# Patient Record
Sex: Female | Born: 1960
Health system: Southern US, Community
[De-identification: ages and names within clinical notes are randomized; demographics above are authoritative.]

## PROBLEM LIST (undated history)

## (undated) DIAGNOSIS — I82409 Acute embolism and thrombosis of unspecified deep veins of unspecified lower extremity: Secondary | ICD-10-CM

## (undated) DIAGNOSIS — F419 Anxiety disorder, unspecified: Secondary | ICD-10-CM

## (undated) DIAGNOSIS — F329 Major depressive disorder, single episode, unspecified: Secondary | ICD-10-CM

## (undated) DIAGNOSIS — E785 Hyperlipidemia, unspecified: Secondary | ICD-10-CM

## (undated) DIAGNOSIS — F32A Depression, unspecified: Secondary | ICD-10-CM

## (undated) DIAGNOSIS — D689 Coagulation defect, unspecified: Secondary | ICD-10-CM

## (undated) DIAGNOSIS — Z5189 Encounter for other specified aftercare: Secondary | ICD-10-CM

## (undated) DIAGNOSIS — I10 Essential (primary) hypertension: Secondary | ICD-10-CM

## (undated) HISTORY — DX: Coagulation defect, unspecified: D68.9

## (undated) HISTORY — PX: HIP FRACTURE SURGERY: SHX118

## (undated) HISTORY — PX: TUBAL LIGATION: SHX77

## (undated) HISTORY — DX: Acute embolism and thrombosis of unspecified deep veins of unspecified lower extremity: I82.409

## (undated) HISTORY — DX: Depression, unspecified: F32.A

## (undated) HISTORY — DX: Essential (primary) hypertension: I10

## (undated) HISTORY — DX: Encounter for other specified aftercare: Z51.89

## (undated) HISTORY — PX: CARPAL TUNNEL RELEASE: SHX101

## (undated) HISTORY — DX: Hyperlipidemia, unspecified: E78.5

## (undated) HISTORY — DX: Anxiety disorder, unspecified: F41.9

---
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
## 2019-02-12 DIAGNOSIS — F17290 Nicotine dependence, other tobacco product, uncomplicated: Secondary | ICD-10-CM | POA: Diagnosis not present

## 2019-02-12 DIAGNOSIS — F411 Generalized anxiety disorder: Secondary | ICD-10-CM | POA: Diagnosis not present

## 2019-02-12 DIAGNOSIS — F331 Major depressive disorder, recurrent, moderate: Secondary | ICD-10-CM | POA: Diagnosis not present

## 2019-02-12 DIAGNOSIS — I10 Essential (primary) hypertension: Secondary | ICD-10-CM | POA: Diagnosis not present

## 2019-03-11 DIAGNOSIS — R635 Abnormal weight gain: Secondary | ICD-10-CM | POA: Diagnosis not present

## 2019-03-11 DIAGNOSIS — F331 Major depressive disorder, recurrent, moderate: Secondary | ICD-10-CM | POA: Diagnosis not present

## 2019-03-11 DIAGNOSIS — F411 Generalized anxiety disorder: Secondary | ICD-10-CM | POA: Diagnosis not present

## 2019-03-11 DIAGNOSIS — I10 Essential (primary) hypertension: Secondary | ICD-10-CM | POA: Diagnosis not present

## 2019-04-23 DIAGNOSIS — Z20828 Contact with and (suspected) exposure to other viral communicable diseases: Secondary | ICD-10-CM | POA: Diagnosis not present

## 2019-04-23 DIAGNOSIS — J069 Acute upper respiratory infection, unspecified: Secondary | ICD-10-CM | POA: Diagnosis not present

## 2019-05-09 DIAGNOSIS — E782 Mixed hyperlipidemia: Secondary | ICD-10-CM | POA: Diagnosis not present

## 2019-05-09 DIAGNOSIS — Z0184 Encounter for antibody response examination: Secondary | ICD-10-CM | POA: Diagnosis not present

## 2019-05-09 DIAGNOSIS — Z0000 Encounter for general adult medical examination without abnormal findings: Secondary | ICD-10-CM | POA: Diagnosis not present

## 2019-05-14 DIAGNOSIS — Z6837 Body mass index (BMI) 37.0-37.9, adult: Secondary | ICD-10-CM | POA: Diagnosis not present

## 2019-05-14 DIAGNOSIS — Z01419 Encounter for gynecological examination (general) (routine) without abnormal findings: Secondary | ICD-10-CM | POA: Diagnosis not present

## 2019-05-14 DIAGNOSIS — Z1211 Encounter for screening for malignant neoplasm of colon: Secondary | ICD-10-CM | POA: Diagnosis not present

## 2019-05-14 DIAGNOSIS — Z23 Encounter for immunization: Secondary | ICD-10-CM | POA: Diagnosis not present

## 2019-05-14 DIAGNOSIS — Z0000 Encounter for general adult medical examination without abnormal findings: Secondary | ICD-10-CM | POA: Diagnosis not present

## 2019-05-15 DIAGNOSIS — F411 Generalized anxiety disorder: Secondary | ICD-10-CM | POA: Diagnosis not present

## 2019-05-15 DIAGNOSIS — F331 Major depressive disorder, recurrent, moderate: Secondary | ICD-10-CM | POA: Diagnosis not present

## 2019-05-15 DIAGNOSIS — R635 Abnormal weight gain: Secondary | ICD-10-CM | POA: Diagnosis not present

## 2019-05-17 ENCOUNTER — Encounter: Payer: Self-pay | Admitting: Internal Medicine

## 2019-05-22 ENCOUNTER — Other Ambulatory Visit: Payer: Self-pay | Admitting: Family Medicine

## 2019-05-22 DIAGNOSIS — Z1231 Encounter for screening mammogram for malignant neoplasm of breast: Secondary | ICD-10-CM

## 2019-06-07 ENCOUNTER — Other Ambulatory Visit: Payer: Self-pay

## 2019-06-07 ENCOUNTER — Ambulatory Visit (AMBULATORY_SURGERY_CENTER): Payer: Self-pay

## 2019-06-07 VITALS — Ht 65.0 in | Wt 230.0 lb

## 2019-06-07 DIAGNOSIS — Z01818 Encounter for other preprocedural examination: Secondary | ICD-10-CM

## 2019-06-07 DIAGNOSIS — Z1211 Encounter for screening for malignant neoplasm of colon: Secondary | ICD-10-CM

## 2019-06-07 MED ORDER — PLENVU 140 G PO SOLR: 1.0000 | Freq: Once | ORAL | 0 refills | Status: AC

## 2019-06-07 NOTE — Progress Notes (Signed)
Denies allergies to eggs or soy products. Denies complication of anesthesia or sedation. Denies use of weight loss medication. Denies use of O2.   Emmi instructions given for colonoscopy.  

## 2019-06-10 ENCOUNTER — Ambulatory Visit (INDEPENDENT_AMBULATORY_CARE_PROVIDER_SITE_OTHER): Payer: BC Managed Care – PPO

## 2019-06-10 DIAGNOSIS — Z1159 Encounter for screening for other viral diseases: Secondary | ICD-10-CM

## 2019-06-11 ENCOUNTER — Telehealth: Payer: Self-pay | Admitting: Internal Medicine

## 2019-06-11 ENCOUNTER — Other Ambulatory Visit: Payer: Self-pay

## 2019-06-11 DIAGNOSIS — R197 Diarrhea, unspecified: Secondary | ICD-10-CM

## 2019-06-11 LAB — SARS CORONAVIRUS 2 (TAT 6-24 HRS): SARS Coronavirus 2: NEGATIVE

## 2019-06-11 NOTE — Telephone Encounter (Signed)
Stat stool for C. difficile.  She should not prep for her colonoscopy until that result is back.  Thus, if she does this today we may have an answer by days end tomorrow

## 2019-06-11 NOTE — Telephone Encounter (Signed)
Pt states she cannot come today as she is babysitting her grandchild due to her daughter being sick. Pt will come in the am, she knows the procedure may have to be rescheduled if results are not back in time.

## 2019-06-11 NOTE — Telephone Encounter (Signed)
Pt is scheduled for colon on Thursday. Reports she has had Cdiff in the past and she is now having 3-6 super loose stools with terrible odor. She wonders if she has cdiff again. States she has not been on any antibiotics recently. Please advise.

## 2019-06-12 ENCOUNTER — Other Ambulatory Visit: Payer: BC Managed Care – PPO

## 2019-06-12 ENCOUNTER — Encounter: Payer: Self-pay | Admitting: Internal Medicine

## 2019-06-12 DIAGNOSIS — R197 Diarrhea, unspecified: Secondary | ICD-10-CM | POA: Diagnosis not present

## 2019-06-13 ENCOUNTER — Encounter: Payer: Self-pay | Admitting: Internal Medicine

## 2019-06-13 DIAGNOSIS — F331 Major depressive disorder, recurrent, moderate: Secondary | ICD-10-CM | POA: Diagnosis not present

## 2019-06-13 DIAGNOSIS — I10 Essential (primary) hypertension: Secondary | ICD-10-CM | POA: Diagnosis not present

## 2019-06-13 DIAGNOSIS — F411 Generalized anxiety disorder: Secondary | ICD-10-CM | POA: Diagnosis not present

## 2019-06-13 LAB — CLOSTRIDIUM DIFFICILE BY PCR: Toxigenic C. Difficile by PCR: NEGATIVE

## 2019-06-17 ENCOUNTER — Other Ambulatory Visit: Payer: Self-pay

## 2019-06-17 ENCOUNTER — Encounter: Payer: Self-pay | Admitting: Internal Medicine

## 2019-06-17 ENCOUNTER — Ambulatory Visit (AMBULATORY_SURGERY_CENTER): Payer: BC Managed Care – PPO | Admitting: Internal Medicine

## 2019-06-17 VITALS — BP 142/99 | HR 64 | Temp 95.0°F | Resp 16 | Ht 65.0 in | Wt 230.0 lb

## 2019-06-17 DIAGNOSIS — D122 Benign neoplasm of ascending colon: Secondary | ICD-10-CM

## 2019-06-17 DIAGNOSIS — K635 Polyp of colon: Secondary | ICD-10-CM

## 2019-06-17 DIAGNOSIS — Z86010 Personal history of colonic polyps: Secondary | ICD-10-CM

## 2019-06-17 DIAGNOSIS — D123 Benign neoplasm of transverse colon: Secondary | ICD-10-CM

## 2019-06-17 DIAGNOSIS — R197 Diarrhea, unspecified: Secondary | ICD-10-CM | POA: Diagnosis not present

## 2019-06-17 MED ORDER — SODIUM CHLORIDE 0.9 % IV SOLN: 500.0000 mL | Freq: Once | INTRAVENOUS | Status: DC

## 2019-06-17 NOTE — Patient Instructions (Signed)
   Information on polyps and diverticulosis given to you today  Await pathology results on polyps removed today   YOU HAD AN ENDOSCOPIC PROCEDURE TODAY AT [facility]:   Refer to the procedure report that was given to you for any specific questions about what was found during the examination.  If the procedure report does not answer your questions, please call your gastroenterologist to clarify.  If you requested that your care partner not be given the details of your procedure findings, then the procedure report has been included in a sealed envelope for you to review at your convenience later.  YOU SHOULD EXPECT: Some feelings of bloating in the abdomen. Passage of more gas than usual.  Walking can help get rid of the air that was put into your GI tract during the procedure and reduce the bloating. If you had a lower endoscopy (such as a colonoscopy or flexible sigmoidoscopy) you may notice spotting of blood in your stool or on the toilet paper. If you underwent a bowel prep for your procedure, you may not have a normal bowel movement for a few days.  Please Note:  You might notice some irritation and congestion in your nose or some drainage.  This is from the oxygen used during your procedure.  There is no need for concern and it should clear up in a day or so.  SYMPTOMS TO REPORT IMMEDIATELY:   Following lower endoscopy (colonoscopy or flexible sigmoidoscopy):  Excessive amounts of blood in the stool  Significant tenderness or worsening of abdominal pains  Swelling of the abdomen that is new, acute  Fever of 100F or higher      For urgent or emergent issues, a gastroenterologist can be reached at any hour by calling [phone].   DIET:  We do recommend a small meal at first, but then you may proceed to your regular diet.  Drink plenty of fluids but you should avoid alcoholic beverages for 24 hours.  ACTIVITY:  You should plan to take it easy for the rest of  today and you should NOT DRIVE or use heavy machinery until tomorrow (because of the sedation medicines used during the test).    FOLLOW UP: Our staff will call the number listed on your records 48-72 hours following your procedure to check on you and address any questions or concerns that you may have regarding the information given to you following your procedure. If we do not reach you, we will leave a message.  We will attempt to reach you two times.  During this call, we will ask if you have developed any symptoms of COVID 19. If you develop any symptoms (ie: fever, flu-like symptoms, shortness of breath, cough etc.) before then, please call [phone].  If you test positive for Covid 19 in the 2 weeks post procedure, please call and report this information to [place].    If any biopsies were taken you will be contacted by phone or by letter within the next 1-3 weeks.  Please call us at [phone] if you have not heard about the biopsies in 3 weeks.    SIGNATURES/CONFIDENTIALITY: You and/or your care partner have signed paperwork which will be entered into your electronic medical record.  These signatures attest to the fact that that the information above on your After Visit Summary has been reviewed and is understood.  Full responsibility of the confidentiality of this discharge information lies with you and/or your care-partner.

## 2019-06-17 NOTE — Progress Notes (Signed)
Pt's states no medical or surgical changes since previsit or office visit.  Temp taken by LC VS taken by CW  

## 2019-06-17 NOTE — Progress Notes (Signed)
Report to PACU, RN, vss, BBS= Clear.  

## 2019-06-17 NOTE — Op Note (Signed)
[facility] Patient Name: Tami Dominguez Procedure Date: 06/17/2019 2:24 PM MRN: [id number] Endoscopist: Docia Chuck. Henrene Pastor , MD Age: 59 Referring MD:  Date of Birth: 04/20/1961 Gender: Female Account #: [id number] Procedure:                Colonoscopy with cold snare polypectomy x 3 Indications:              High risk colon cancer surveillance: Personal                            history of colonic polyps. The patient reports 2                            prior colonoscopies in [place]. Each time with                            polyps. Last exam about 5 years ago. Was advised to                            follow-up in 5 years. No details. Was having issues                            with diarrhea, which has resolved. Medicines:                Monitored Anesthesia Care Procedure:                Pre-Anesthesia Assessment:                           - Prior to the procedure, a History and Physical                            was performed, and patient medications and                            allergies were reviewed. The patient's tolerance of                            previous anesthesia was also reviewed. The risks                            and benefits of the procedure and the sedation                            options and risks were discussed with the patient.                            All questions were answered, and informed consent                            was obtained. Prior Anticoagulants: The patient has                            taken no previous anticoagulant or antiplatelet  agents. After reviewing the risks and benefits, the                            patient was deemed in satisfactory condition to                            undergo the procedure.                           After obtaining informed consent, the colonoscope                            was passed under direct vision. Throughout the                            procedure, the  patient's blood pressure, pulse, and                            oxygen saturations were monitored continuously. The                            Colonoscope was introduced through the anus and                            advanced to the the cecum, identified by                            appendiceal orifice and ileocecal valve. The                            ileocecal valve, appendiceal orifice, and rectum                            were photographed. The quality of the bowel                            preparation was good. The colonoscopy was performed                            without difficulty. The patient tolerated the                            procedure well. The bowel preparation used was                            SUPREP via split dose instruction. Scope In: 2:32:50 PM Scope Out: 2:48:24 PM Scope Withdrawal Time: 0 hours 12 minutes 52 seconds  Total Procedure Duration: 0 hours 15 minutes 34 seconds  Findings:                 Three polyps were found in the transverse colon and                            ascending colon. The polyps were 2 to 5 mm in size.  These polyps were removed with a cold snare.                            Resection and retrieval were complete.                           There were a few small scattered diverticula.                            Internal hemorrhoids were found during retroflexion.                           The exam was otherwise without abnormality on                            direct and retroflexion views. Complications:            No immediate complications. Estimated blood loss:                            None. Estimated Blood Loss:     Estimated blood loss: none. Impression:               - Three 2 to 5 mm polyps in the transverse colon                            and in the ascending colon, removed with a cold                            snare. Resected and retrieved.                           -Mild diverticulosis.  Internal hemorrhoids.                           - The examination was otherwise normal on direct                            and retroflexion views. Recommendation:           - Repeat colonoscopy in 5 years for surveillance.                           - Patient has a contact number available for                            emergencies. The signs and symptoms of potential                            delayed complications were discussed with the                            patient. Return to normal activities tomorrow.                            Written discharge instructions were provided  to the                            patient.                           - Resume previous diet.                           - Continue present medications.                           - Await pathology results.                           - If possible, please obtain outside colonoscopy                            reports as well as the associated pathology to                            incorporate into your record Docia Chuck. Henrene Pastor, MD 06/17/2019 2:54:18 PM This report has been signed electronically.

## 2019-06-17 NOTE — Progress Notes (Signed)
Called to room to assist during endoscopic procedure.  Patient ID and intended procedure confirmed with present staff. Received instructions for my participation in the procedure from the performing physician.  

## 2019-06-19 ENCOUNTER — Telehealth: Payer: Self-pay

## 2019-06-19 NOTE — Telephone Encounter (Signed)
  Follow up Call-  Call back number 06/17/2019  Post procedure Call Back phone  # [phone]  Permission to leave phone message Yes     Patient questions:  Do you have a fever, pain , or abdominal swelling? No. Pain Score  0 *  Have you tolerated food without any problems? Yes.    Have you been able to return to your normal activities? Yes.    Do you have any questions about your discharge instructions: Diet   No. Medications  No. Follow up visit  No.  Do you have questions or concerns about your Care? No.  Actions: * If pain score is 4 or above: No action needed, pain <4.  1. Have you developed a fever since your procedure? no  2.   Have you had an respiratory symptoms (SOB or cough) since your procedure? no  3.   Have you tested positive for COVID 19 since your procedure no  4.   Have you had any family members/close contacts diagnosed with the COVID 19 since your procedure?  no   If yes to any of these questions please route to Joylene John, RN and Alphonsa Gin, Therapist, sports.

## 2019-06-20 ENCOUNTER — Encounter: Payer: Self-pay | Admitting: Internal Medicine

## 2019-07-09 ENCOUNTER — Ambulatory Visit: Payer: Self-pay

## 2019-07-10 DIAGNOSIS — Z03818 Encounter for observation for suspected exposure to other biological agents ruled out: Secondary | ICD-10-CM | POA: Diagnosis not present

## 2019-07-10 DIAGNOSIS — Z20828 Contact with and (suspected) exposure to other viral communicable diseases: Secondary | ICD-10-CM | POA: Diagnosis not present

## 2019-07-11 DIAGNOSIS — F331 Major depressive disorder, recurrent, moderate: Secondary | ICD-10-CM | POA: Diagnosis not present

## 2019-07-11 DIAGNOSIS — F411 Generalized anxiety disorder: Secondary | ICD-10-CM | POA: Diagnosis not present

## 2019-07-11 DIAGNOSIS — I10 Essential (primary) hypertension: Secondary | ICD-10-CM | POA: Diagnosis not present

## 2019-07-11 DIAGNOSIS — E782 Mixed hyperlipidemia: Secondary | ICD-10-CM | POA: Diagnosis not present

## 2019-07-17 DIAGNOSIS — Z20828 Contact with and (suspected) exposure to other viral communicable diseases: Secondary | ICD-10-CM | POA: Diagnosis not present

## 2019-07-25 DIAGNOSIS — M549 Dorsalgia, unspecified: Secondary | ICD-10-CM | POA: Diagnosis not present

## 2019-08-13 DIAGNOSIS — Z6837 Body mass index (BMI) 37.0-37.9, adult: Secondary | ICD-10-CM | POA: Diagnosis not present

## 2019-08-13 DIAGNOSIS — M549 Dorsalgia, unspecified: Secondary | ICD-10-CM | POA: Diagnosis not present

## 2019-08-13 DIAGNOSIS — F411 Generalized anxiety disorder: Secondary | ICD-10-CM | POA: Diagnosis not present

## 2019-08-13 DIAGNOSIS — R635 Abnormal weight gain: Secondary | ICD-10-CM | POA: Diagnosis not present

## 2019-08-19 DIAGNOSIS — R197 Diarrhea, unspecified: Secondary | ICD-10-CM | POA: Diagnosis not present

## 2019-08-19 DIAGNOSIS — F331 Major depressive disorder, recurrent, moderate: Secondary | ICD-10-CM | POA: Diagnosis not present

## 2019-08-19 DIAGNOSIS — F411 Generalized anxiety disorder: Secondary | ICD-10-CM | POA: Diagnosis not present

## 2019-10-24 DIAGNOSIS — E782 Mixed hyperlipidemia: Secondary | ICD-10-CM | POA: Diagnosis not present

## 2019-10-24 DIAGNOSIS — E785 Hyperlipidemia, unspecified: Secondary | ICD-10-CM | POA: Diagnosis not present

## 2019-10-24 DIAGNOSIS — R7303 Prediabetes: Secondary | ICD-10-CM | POA: Diagnosis not present

## 2019-10-24 DIAGNOSIS — I10 Essential (primary) hypertension: Secondary | ICD-10-CM | POA: Diagnosis not present

## 2019-10-30 DIAGNOSIS — E1165 Type 2 diabetes mellitus with hyperglycemia: Secondary | ICD-10-CM | POA: Diagnosis not present

## 2019-10-30 DIAGNOSIS — F331 Major depressive disorder, recurrent, moderate: Secondary | ICD-10-CM | POA: Diagnosis not present

## 2019-10-30 DIAGNOSIS — Z2821 Immunization not carried out because of patient refusal: Secondary | ICD-10-CM | POA: Diagnosis not present

## 2019-10-30 DIAGNOSIS — F411 Generalized anxiety disorder: Secondary | ICD-10-CM | POA: Diagnosis not present

## 2019-10-30 DIAGNOSIS — G4700 Insomnia, unspecified: Secondary | ICD-10-CM | POA: Diagnosis not present

## 2019-11-06 DIAGNOSIS — Z23 Encounter for immunization: Secondary | ICD-10-CM | POA: Diagnosis not present

## 2019-12-03 ENCOUNTER — Telehealth: Payer: Self-pay | Admitting: General Practice

## 2019-12-03 NOTE — Telephone Encounter (Signed)
That is okay, arrange a visit at her convenience

## 2019-12-03 NOTE — Telephone Encounter (Signed)
Patient states she would like to establish care with you.  Please Advise

## 2019-12-11 NOTE — Telephone Encounter (Signed)
Called patient back, went to voice mail. Her voice mail has not been set up yet unable to leave message

## 2019-12-13 ENCOUNTER — Other Ambulatory Visit: Payer: Self-pay

## 2019-12-13 DIAGNOSIS — M79606 Pain in leg, unspecified: Secondary | ICD-10-CM

## 2019-12-14 DIAGNOSIS — S61208A Unspecified open wound of other finger without damage to nail, initial encounter: Secondary | ICD-10-CM | POA: Diagnosis not present

## 2019-12-17 DIAGNOSIS — I10 Essential (primary) hypertension: Secondary | ICD-10-CM | POA: Diagnosis not present

## 2019-12-17 DIAGNOSIS — E119 Type 2 diabetes mellitus without complications: Secondary | ICD-10-CM | POA: Diagnosis not present

## 2019-12-17 DIAGNOSIS — E038 Other specified hypothyroidism: Secondary | ICD-10-CM | POA: Diagnosis not present

## 2019-12-17 DIAGNOSIS — E559 Vitamin D deficiency, unspecified: Secondary | ICD-10-CM | POA: Diagnosis not present

## 2019-12-17 DIAGNOSIS — D518 Other vitamin B12 deficiency anemias: Secondary | ICD-10-CM | POA: Diagnosis not present

## 2019-12-17 DIAGNOSIS — E782 Mixed hyperlipidemia: Secondary | ICD-10-CM | POA: Diagnosis not present

## 2019-12-25 ENCOUNTER — Ambulatory Visit (INDEPENDENT_AMBULATORY_CARE_PROVIDER_SITE_OTHER): Payer: BC Managed Care – PPO | Admitting: Vascular Surgery

## 2019-12-25 ENCOUNTER — Other Ambulatory Visit: Payer: Self-pay

## 2019-12-25 ENCOUNTER — Encounter: Payer: Self-pay | Admitting: Vascular Surgery

## 2019-12-25 ENCOUNTER — Ambulatory Visit (HOSPITAL_COMMUNITY)
Admission: RE | Admit: 2019-12-25 | Discharge: 2019-12-25 | Disposition: A | Discharge status: Home / Self Care | Payer: BC Managed Care – PPO | Source: Ambulatory Visit | Attending: Vascular Surgery | Admitting: Vascular Surgery

## 2019-12-25 VITALS — BP 137/88 | HR 73 | Temp 98.1°F | Resp 20 | Ht 65.0 in | Wt 235.0 lb

## 2019-12-25 DIAGNOSIS — R1031 Right lower quadrant pain: Secondary | ICD-10-CM

## 2019-12-25 DIAGNOSIS — M79606 Pain in leg, unspecified: Secondary | ICD-10-CM | POA: Diagnosis not present

## 2019-12-25 DIAGNOSIS — G8929 Other chronic pain: Secondary | ICD-10-CM

## 2019-12-25 NOTE — Progress Notes (Signed)
REASON FOR CONSULT:    Leg and groin pain.  The consult is requested by Dr. Rachell Cipro.  ASSESSMENT & PLAN:   RIGHT GROIN PAIN AND LEFT THIGH PAIN: This patient has had a several month history of pain in her right groin and also some paresthesias in her right thigh.  Based on her history, I think hip arthritis and degenerative disc disease of the back would be in the differential diagnosis.  I reassured her that her arterial Doppler studies today showed no evidence of significant peripheral vascular disease.  In addition, her symptoms do not fit with symptoms from peripheral vascular disease.  She has palpable pedal pulses.  Likewise, her symptoms do not fit with symptoms from venous disease.  If her hip x-rays are back her x-rays are unremarkable and perhaps a CT abdomen and pelvis would be useful as she is very concerned that she may have a mass or cancer pushing on a nerve.  She has some family history of cancer.  I will be happy to see her back at any time if any new vascular issues arise.  Deitra Mayo, MD Office: [phone]   HPI:   Tami Dominguez is a pleasant 59 y.o. female, who was referred with right leg pain.  I reviewed the records from the referring office.  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  She has right leg pain in the thigh area and also in the groin.  Symptoms are aggravated when she walks.  She denies any history of hip pain.  She was concerned this might was vascular.  On my history the patient developed a gradual onset of pain in the right groin approximately 3 months ago.  She also developed some paresthesias in the right thigh.  She states that her symptoms are aggravated sometimes by walking.  They also are bothersome at night and hurt when she is turning in bed.  I do not get any history of calf claudication or thigh claudication.  She has no history of rest pain or nonhealing ulcers.  Her risk factors for peripheral vascular  disease include hypertension, hypercholesterolemia, and a history of tobacco use.  She smokes a pack per day of cigarettes and has been smoking since she was 19.  She denies any history of diabetes or family history of premature cardiovascular disease.  She did have a PE in the past.  She thinks this was in November 2016.  She is been on Eliquis for this.  She denies any history of DVT.  Past Medical History:  Diagnosis Date  . Anxiety   . Blood transfusion without reported diagnosis   . Clotting disorder ([place])   . Depression   . DVT (deep venous thrombosis) ([place])   . Hyperlipidemia   . Hypertension     Family History  Problem Relation Age of Onset  . Colon cancer Neg Hx   . Esophageal cancer Neg Hx   . Rectal cancer Neg Hx   . Stomach cancer Neg Hx     SOCIAL HISTORY: Social History   Socioeconomic History  . Marital status: Married    Spouse name: Not on file  . Number of children: Not on file  . Years of education: Not on file  . Highest education level: Not on file  Occupational History  . Not on file  Tobacco Use  . Smoking status: Current Every Day Smoker    Packs/day: 1.00  . Smokeless  tobacco: Never Used  Vaping Use  . Vaping Use: Never used  Substance and Sexual Activity  . Alcohol use: Yes    Comment: occasionally  . Drug use: Never  . Sexual activity: Not on file  Other Topics Concern  . Not on file  Social History Narrative  . Not on file   Social Determinants of Health   Financial Resource Strain:   . Difficulty of Paying Living Expenses: Not on file  Food Insecurity:   . Worried About Charity fundraiser in the Last Year: Not on file  . Ran Out of Food in the Last Year: Not on file  Transportation Needs:   . Lack of Transportation (Medical): Not on file  . Lack of Transportation (Non-Medical): Not on file  Physical Activity:   . Days of Exercise per Week: Not on file  . Minutes of Exercise per Session: Not on file  Stress:   . Feeling of  Stress : Not on file  Social Connections:   . Frequency of Communication with Friends and Family: Not on file  . Frequency of Social Gatherings with Friends and Family: Not on file  . Attends Religious Services: Not on file  . Active Member of Clubs or Organizations: Not on file  . Attends Archivist Meetings: Not on file  . Marital Status: Not on file  Intimate Partner Violence:   . Fear of Current or Ex-Partner: Not on file  . Emotionally Abused: Not on file  . Physically Abused: Not on file  . Sexually Abused: Not on file    No Known Allergies  Current Outpatient Medications  Medication Sig Dispense Refill  . DULoxetine (CYMBALTA) 30 MG capsule Take by mouth.    . olmesartan-hydrochlorothiazide (BENICAR HCT) 40-25 MG tablet Take 1 tablet by mouth daily.    Marland Kitchen OVER THE COUNTER MEDICATION Vitamin D one capsule daily.    Marland Kitchen amLODipine (NORVASC) 2.5 MG tablet Take 1 tablet by mouth daily. (Patient not taking: Reported on 12/25/2019)    . ELIQUIS 5 MG TABS tablet Take 5 mg by mouth 2 (two) times daily. (Patient not taking: Reported on 12/25/2019)    . hydrOXYzine (ATARAX/VISTARIL) 25 MG tablet Take 25 mg by mouth 2 (two) times daily. (Patient not taking: Reported on 12/25/2019)     No current facility-administered medications for this visit.    REVIEW OF SYSTEMS:  [X]  denotes positive finding, [ ]  denotes negative finding Cardiac  Comments:  Chest pain or chest pressure:    Shortness of breath upon exertion: x   Short of breath when lying flat:    Irregular heart rhythm:        Vascular    Pain in calf, thigh, or hip brought on by ambulation: x   Pain in feet at night that wakes you up from your sleep:     Blood clot in your veins:    Leg swelling:         Pulmonary    Oxygen at home:    Productive cough:     Wheezing:         Neurologic    Sudden weakness in arms or legs:     Sudden numbness in arms or legs:     Sudden onset of difficulty speaking or slurred  speech:    Temporary loss of vision in one eye:     Problems with dizziness:         Gastrointestinal    Blood in  stool:     Vomited blood:         Genitourinary    Burning when urinating:     Blood in urine:        Psychiatric    Major depression:         Hematologic    Bleeding problems:    Problems with blood clotting too easily:        Skin    Rashes or ulcers:        Constitutional    Fever or chills:     PHYSICAL EXAM:   Vitals:   12/25/19 1105  BP: 137/88  Pulse: 73  Resp: 20  Temp: 98.1 F (36.7 C)  SpO2: 93%  Weight: 106.6 kg  Height: 5\' 5"  (1.651 m)   Body mass index is 39.11 kg/m.   GENERAL: The patient is a well-nourished female, in no acute distress. The vital signs are documented above. CARDIAC: There is a regular rate and rhythm.  VASCULAR: I do not detect carotid bruits. She has palpable femoral and posterior tibial pulses bilaterally. She has no significant lower extremity swelling.  She has no significant varicose veins and no hyperpigmentation. PULMONARY: There is good air exchange bilaterally without wheezing or rales. ABDOMEN: Soft and non-tender with normal pitched bowel sounds.  MUSCULOSKELETAL: There are no major deformities or cyanosis. NEUROLOGIC: No focal weakness or paresthesias are detected. SKIN: There are no ulcers or rashes noted. PSYCHIATRIC: The patient has a normal affect.  DATA:    ARTERIAL DOPPLER STUDY: I have independently interpreted the patient's arterial Doppler study today.  On the right side there is a triphasic dorsalis pedis and posterior tibial signal.  ABIs 100%.  Toe pressures 109 mmHg.  On the left side there is a triphasic dorsalis pedis and posterior tibial signal.  ABIs 100%.  Toe pressures 125 mmHg.  LABS: I have reviewed the labs that were done on 10/24/2019.  Creatinine was 1.08.  GFR was 56.

## 2020-01-02 ENCOUNTER — Ambulatory Visit (HOSPITAL_BASED_OUTPATIENT_CLINIC_OR_DEPARTMENT_OTHER)
Admission: RE | Admit: 2020-01-02 | Discharge: 2020-01-02 | Disposition: A | Discharge status: Home / Self Care | Payer: BC Managed Care – PPO | Source: Ambulatory Visit | Attending: Nurse Practitioner | Admitting: Nurse Practitioner

## 2020-01-02 ENCOUNTER — Other Ambulatory Visit (HOSPITAL_BASED_OUTPATIENT_CLINIC_OR_DEPARTMENT_OTHER): Payer: Self-pay | Admitting: Nurse Practitioner

## 2020-01-02 ENCOUNTER — Other Ambulatory Visit: Payer: Self-pay

## 2020-01-02 DIAGNOSIS — M25551 Pain in right hip: Secondary | ICD-10-CM | POA: Diagnosis not present

## 2020-01-02 DIAGNOSIS — M160 Bilateral primary osteoarthritis of hip: Secondary | ICD-10-CM | POA: Diagnosis not present

## 2020-01-06 DIAGNOSIS — F4310 Post-traumatic stress disorder, unspecified: Secondary | ICD-10-CM | POA: Diagnosis not present

## 2020-01-20 DIAGNOSIS — F4310 Post-traumatic stress disorder, unspecified: Secondary | ICD-10-CM | POA: Diagnosis not present

## 2020-01-21 DIAGNOSIS — M79651 Pain in right thigh: Secondary | ICD-10-CM | POA: Diagnosis not present

## 2020-01-21 DIAGNOSIS — M25552 Pain in left hip: Secondary | ICD-10-CM | POA: Diagnosis not present

## 2020-01-21 DIAGNOSIS — M7591 Shoulder lesion, unspecified, right shoulder: Secondary | ICD-10-CM | POA: Diagnosis not present

## 2020-01-28 DIAGNOSIS — Z20822 Contact with and (suspected) exposure to covid-19: Secondary | ICD-10-CM | POA: Diagnosis not present

## 2020-02-06 DIAGNOSIS — F4310 Post-traumatic stress disorder, unspecified: Secondary | ICD-10-CM | POA: Diagnosis not present

## 2020-02-10 ENCOUNTER — Ambulatory Visit (HOSPITAL_BASED_OUTPATIENT_CLINIC_OR_DEPARTMENT_OTHER)
Admission: RE | Admit: 2020-02-10 | Discharge: 2020-02-10 | Disposition: A | Discharge status: Home / Self Care | Payer: BC Managed Care – PPO | Source: Ambulatory Visit | Attending: Nurse Practitioner | Admitting: Nurse Practitioner

## 2020-02-10 ENCOUNTER — Other Ambulatory Visit: Payer: Self-pay

## 2020-02-10 ENCOUNTER — Other Ambulatory Visit (HOSPITAL_BASED_OUTPATIENT_CLINIC_OR_DEPARTMENT_OTHER): Payer: Self-pay | Admitting: Nurse Practitioner

## 2020-02-10 DIAGNOSIS — M545 Low back pain, unspecified: Secondary | ICD-10-CM

## 2020-02-12 DIAGNOSIS — G629 Polyneuropathy, unspecified: Secondary | ICD-10-CM | POA: Diagnosis not present

## 2020-02-12 DIAGNOSIS — M79605 Pain in left leg: Secondary | ICD-10-CM | POA: Diagnosis not present

## 2020-02-12 DIAGNOSIS — M5136 Other intervertebral disc degeneration, lumbar region: Secondary | ICD-10-CM | POA: Diagnosis not present

## 2020-02-14 DIAGNOSIS — F4310 Post-traumatic stress disorder, unspecified: Secondary | ICD-10-CM | POA: Diagnosis not present

## 2020-02-25 DIAGNOSIS — F4310 Post-traumatic stress disorder, unspecified: Secondary | ICD-10-CM | POA: Diagnosis not present

## 2020-02-28 DIAGNOSIS — F4310 Post-traumatic stress disorder, unspecified: Secondary | ICD-10-CM | POA: Diagnosis not present

## 2020-02-29 DIAGNOSIS — Z1231 Encounter for screening mammogram for malignant neoplasm of breast: Secondary | ICD-10-CM | POA: Diagnosis not present

## 2020-03-05 DIAGNOSIS — F331 Major depressive disorder, recurrent, moderate: Secondary | ICD-10-CM | POA: Diagnosis not present

## 2020-03-05 DIAGNOSIS — F411 Generalized anxiety disorder: Secondary | ICD-10-CM | POA: Diagnosis not present

## 2020-04-02 DIAGNOSIS — F411 Generalized anxiety disorder: Secondary | ICD-10-CM | POA: Diagnosis not present

## 2020-04-02 DIAGNOSIS — I10 Essential (primary) hypertension: Secondary | ICD-10-CM | POA: Diagnosis not present

## 2020-05-05 DIAGNOSIS — I10 Essential (primary) hypertension: Secondary | ICD-10-CM | POA: Diagnosis not present

## 2020-05-05 DIAGNOSIS — F4310 Post-traumatic stress disorder, unspecified: Secondary | ICD-10-CM | POA: Diagnosis not present

## 2020-05-05 DIAGNOSIS — G629 Polyneuropathy, unspecified: Secondary | ICD-10-CM | POA: Diagnosis not present

## 2020-05-05 DIAGNOSIS — F410 Panic disorder [episodic paroxysmal anxiety] without agoraphobia: Secondary | ICD-10-CM | POA: Diagnosis not present

## 2020-05-07 DIAGNOSIS — M1712 Unilateral primary osteoarthritis, left knee: Secondary | ICD-10-CM | POA: Diagnosis not present

## 2020-05-07 DIAGNOSIS — M25562 Pain in left knee: Secondary | ICD-10-CM | POA: Diagnosis not present

## 2021-07-29 IMAGING — DX DG HIP (WITH OR WITHOUT PELVIS) 2-3V*R*
3 series · 3 of 3 positions shown · non-contrast
Comparison: None.

CLINICAL DATA: Chronic right hip pain without known injury.

EXAM:
DG HIP (WITH OR WITHOUT PELVIS) 2-3V RIGHT

[pelvis ap]
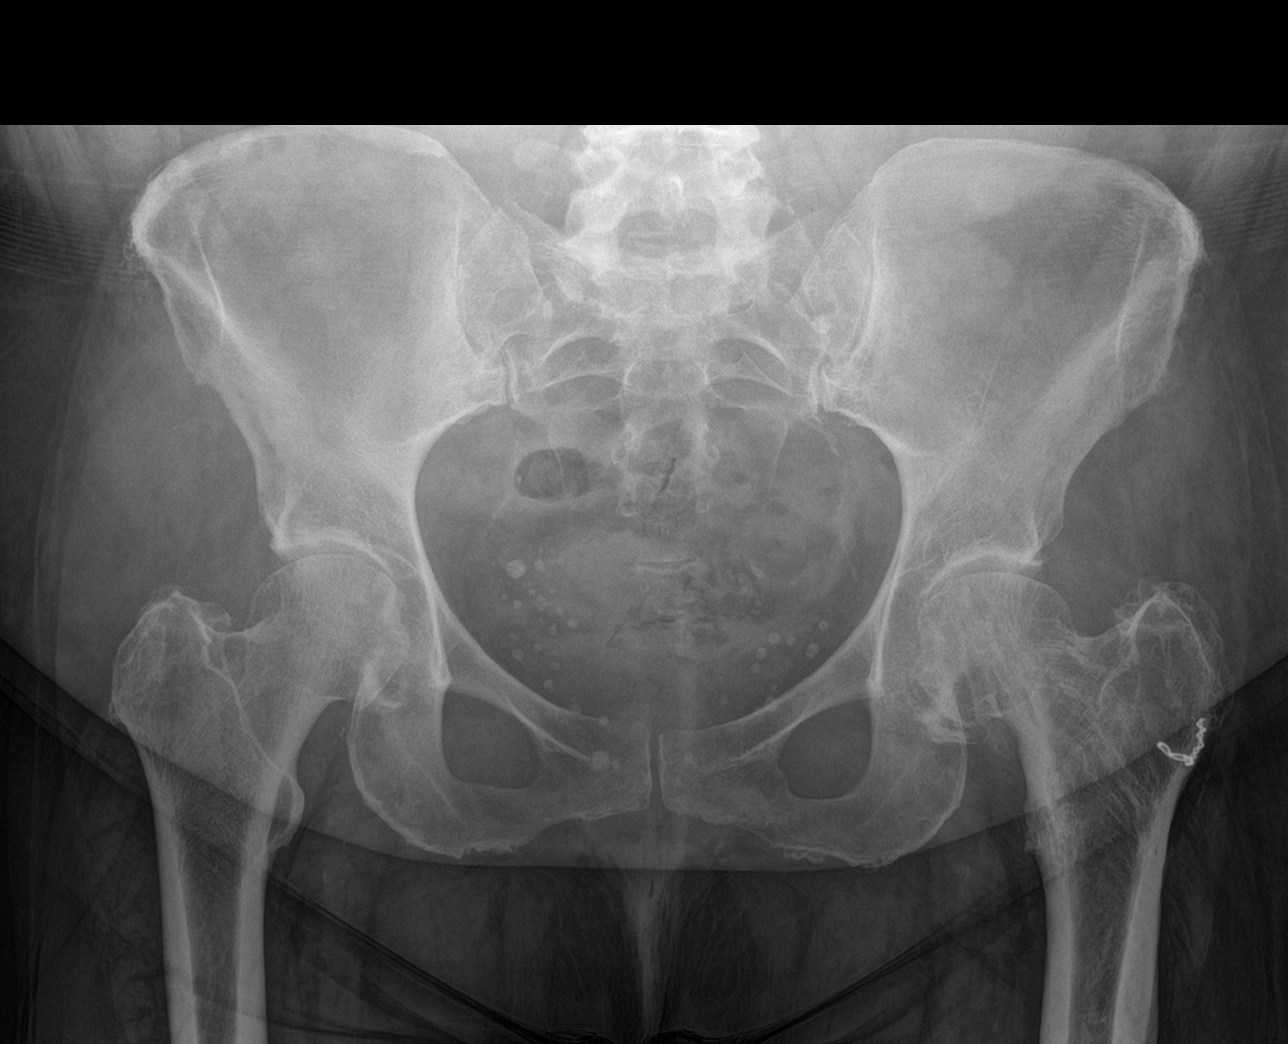

[hip ap]
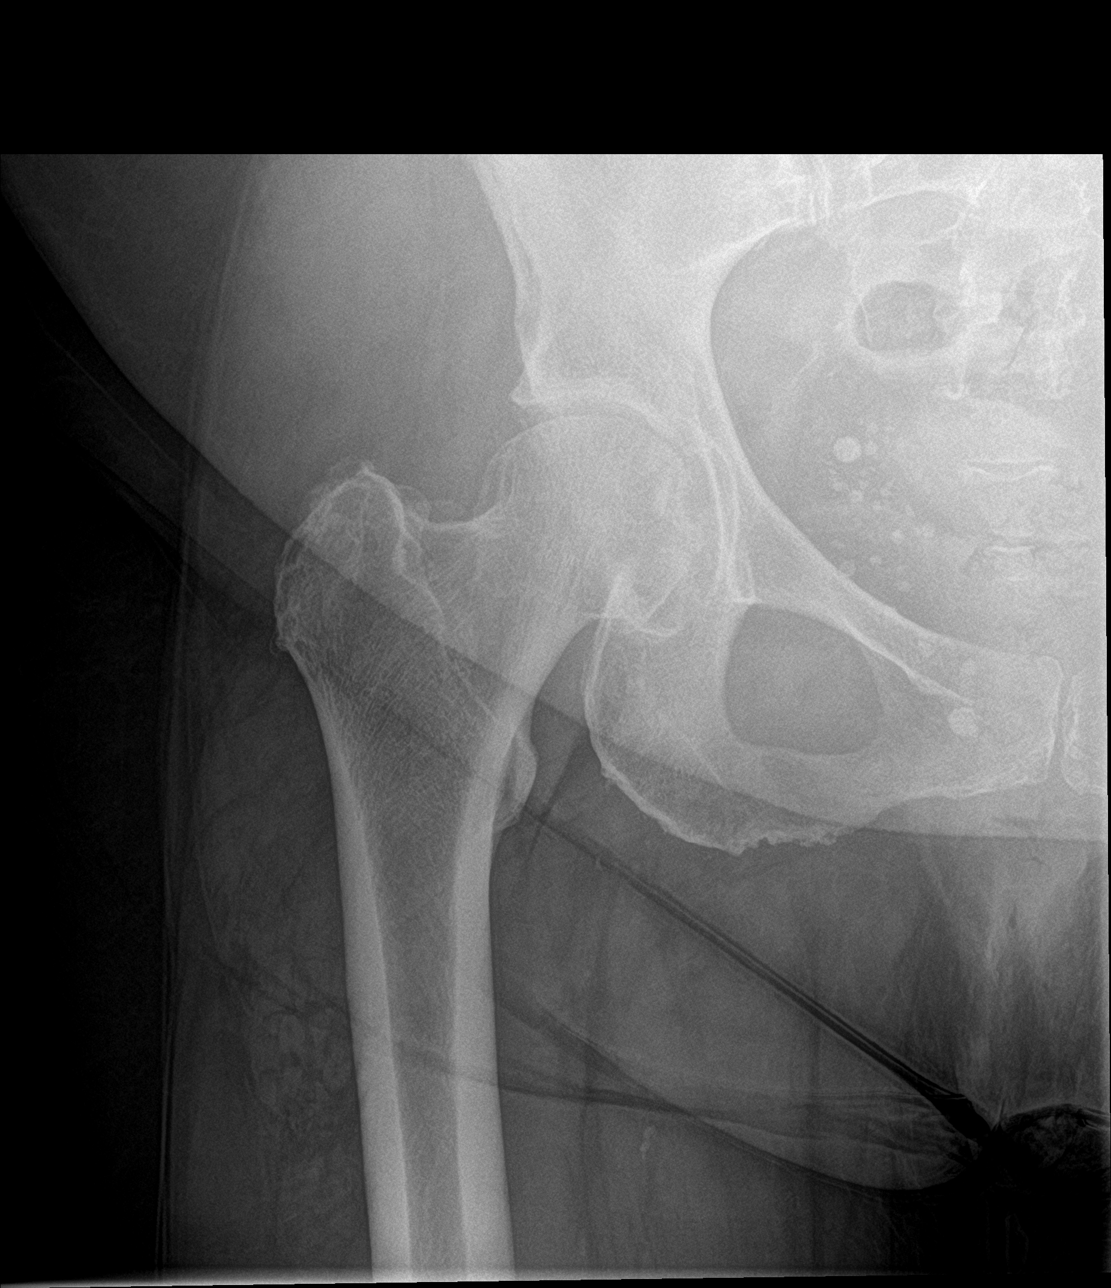

[hip lat]
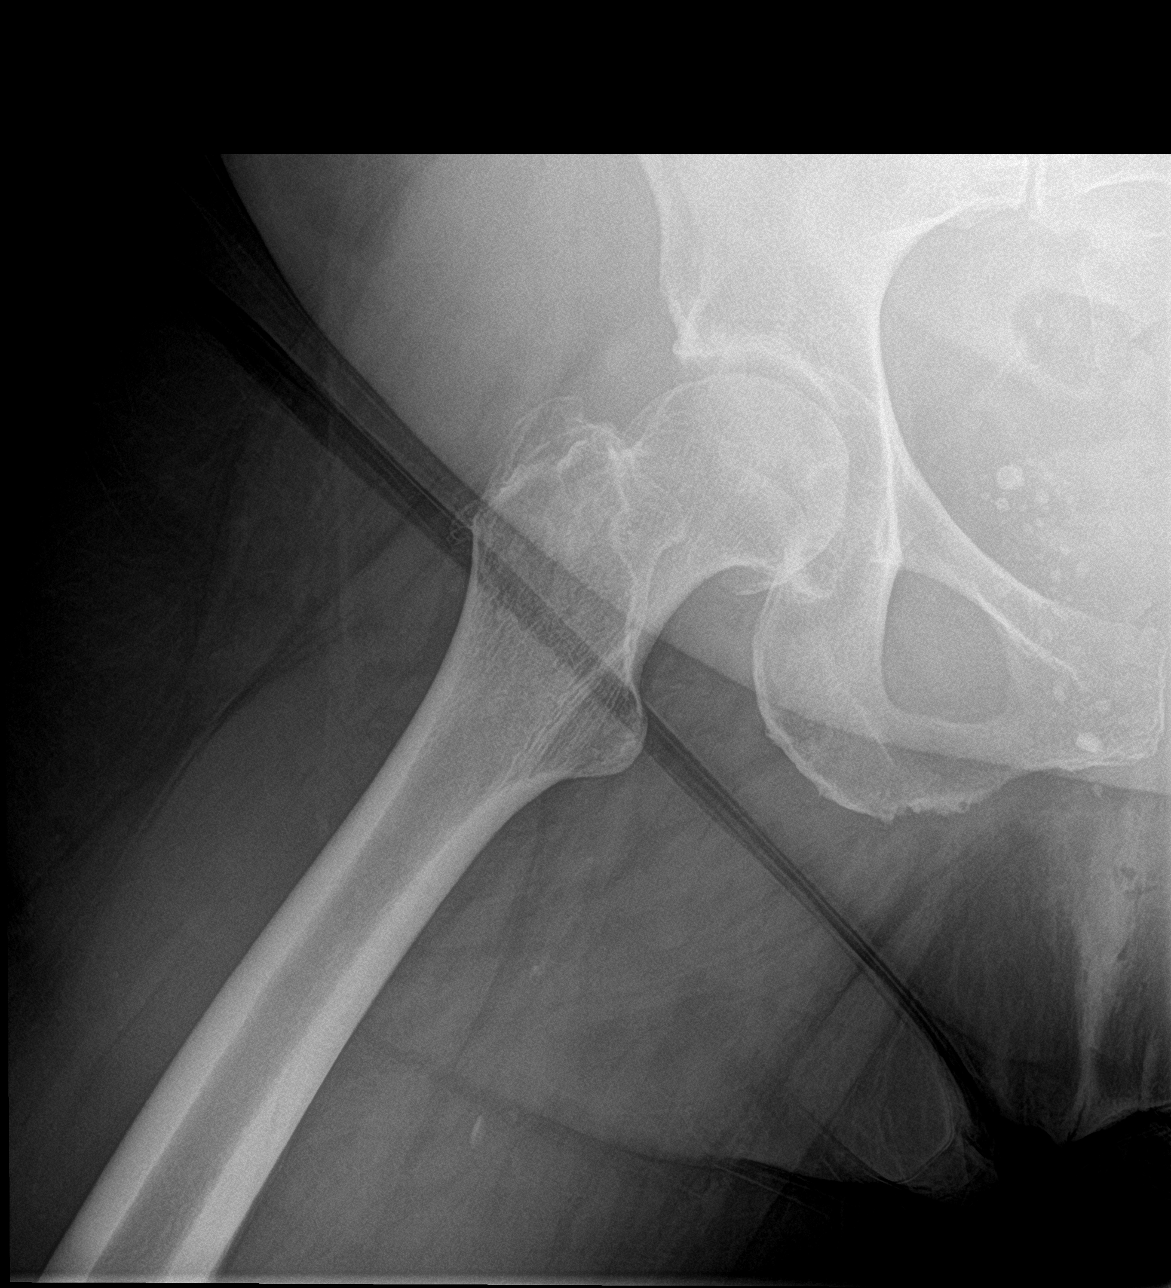

[3 of 3 positions shown; findings below may reference images not displayed]

FINDINGS: There is no evidence of hip fracture or dislocation. Mild narrowing
and osteophyte formation is seen involving the right hip joint.
IMPRESSION: Mild osteoarthritis of the right hip. No acute abnormality seen.

## 2021-09-06 IMAGING — DX DG LUMBAR SPINE 2-3V
3 series · 3 of 3 positions shown · non-contrast
Comparison: None.

CLINICAL DATA: Low back pain radiating to the lower extremities for
several months.

EXAM:
LUMBAR SPINE - 2-3 VIEW

[l-spine ap]
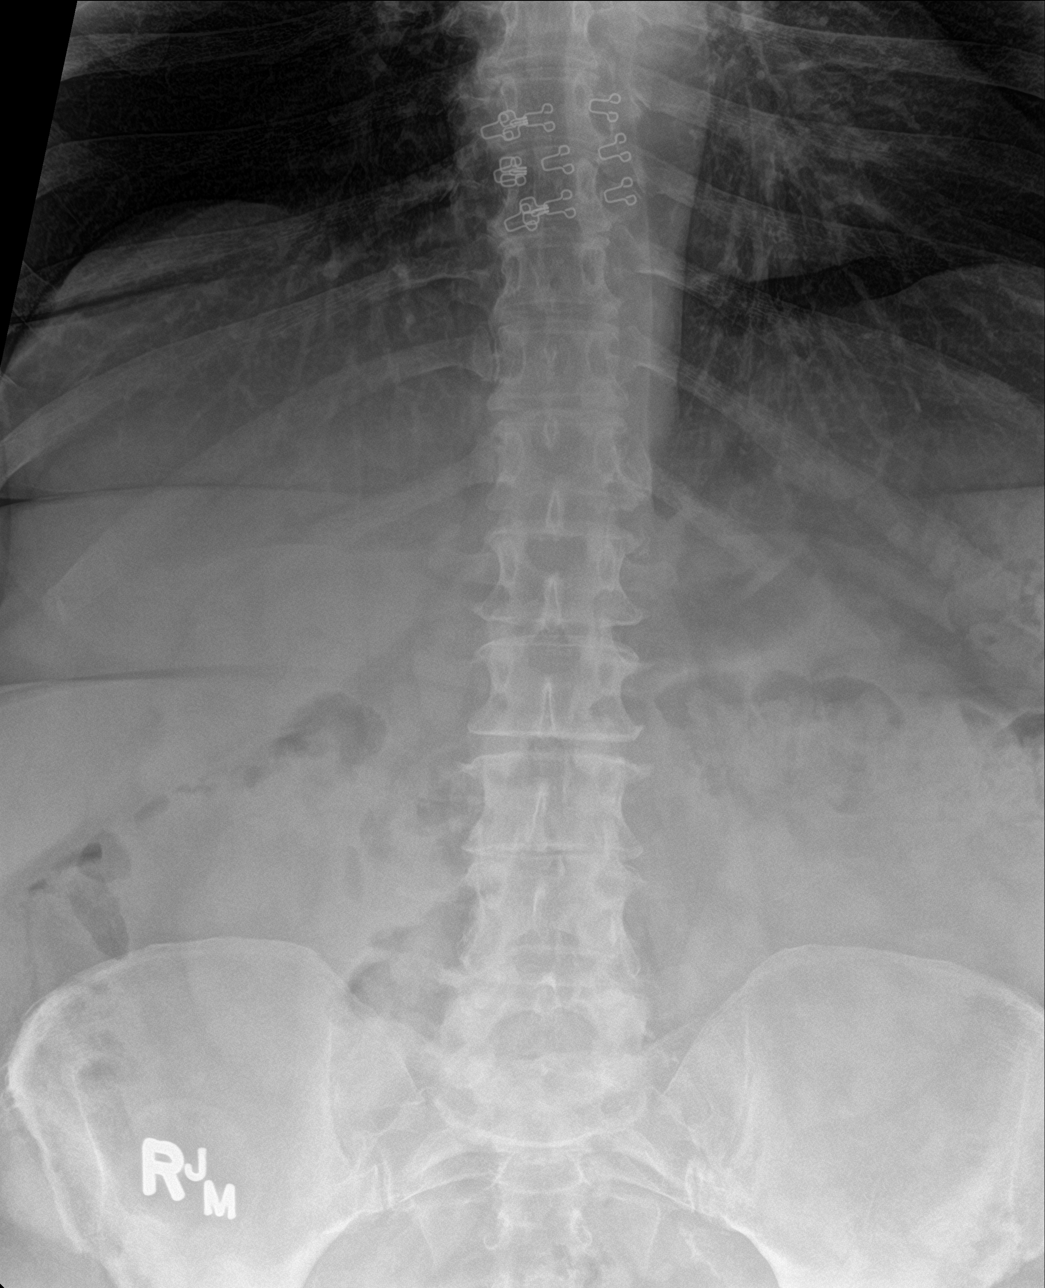

[l-spine lat]
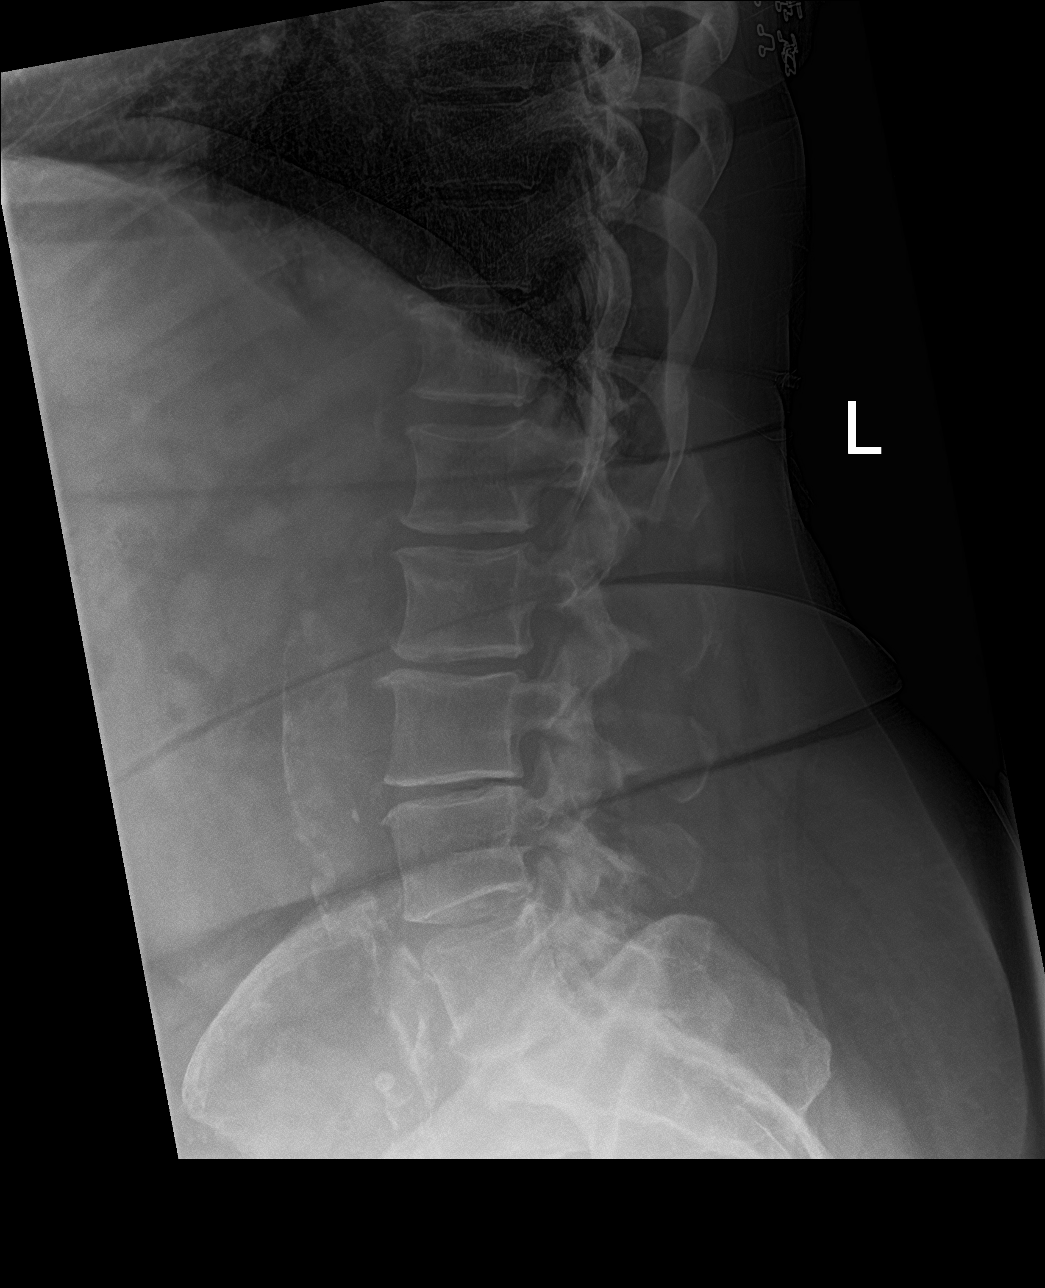

[l-spine spot]
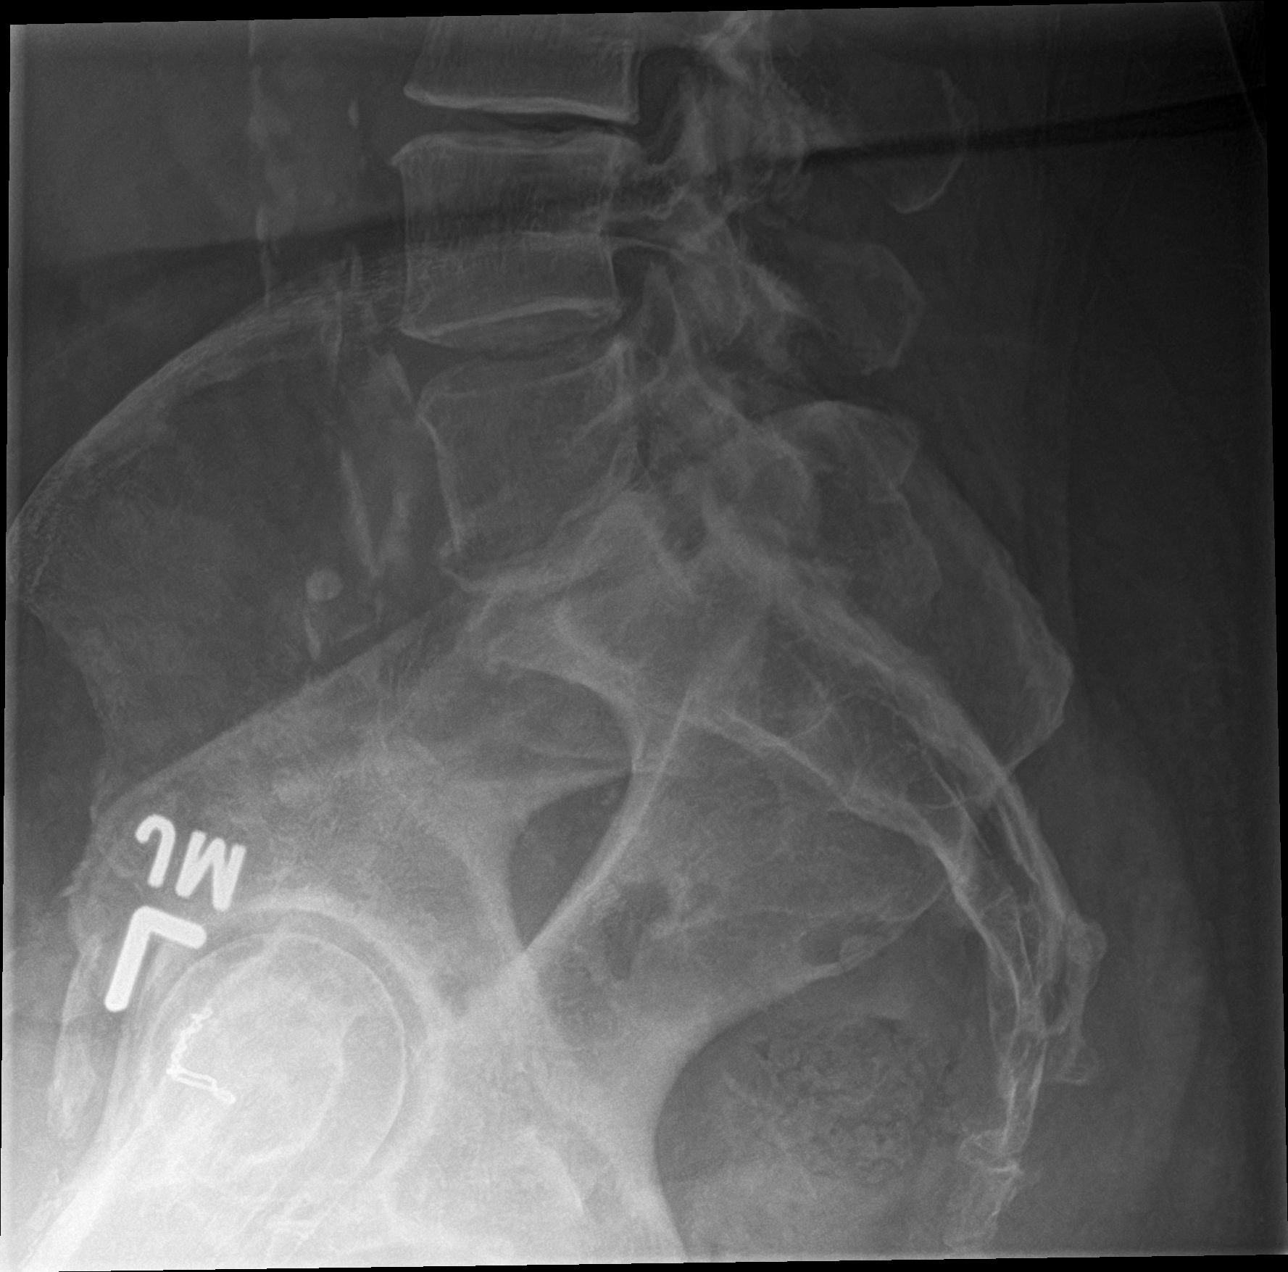

[3 of 3 positions shown; findings below may reference images not displayed]

FINDINGS: Five lumbar type vertebral bodies. Disc space narrowing throughout
the lumbar region most pronounced at L3-4 and L5-S1. Lower lumbar
facet osteoarthritis. No pars defect or slippage. Regional arterial
calcification incidentally noted.
IMPRESSION: Lumbar degenerative disc disease and degenerative facet disease,
most pronounced at L3-4 and L5-S1.
